# Patient Record
Sex: Male | Born: 1980 | Race: Black or African American | Hispanic: No | Marital: Single | Smoking: Former smoker
Health system: Southern US, Community
[De-identification: ages and names within clinical notes are randomized; demographics above are authoritative.]

---

## 2013-03-06 ENCOUNTER — Ambulatory Visit (INDEPENDENT_AMBULATORY_CARE_PROVIDER_SITE_OTHER): Payer: 59 | Admitting: Family Medicine

## 2013-03-06 VITALS — BP 130/86 | HR 68 | Temp 98.7°F | Resp 16 | Ht 68.0 in | Wt 195.4 lb

## 2013-03-06 DIAGNOSIS — Z0000 Encounter for general adult medical examination without abnormal findings: Secondary | ICD-10-CM

## 2013-03-06 LAB — COMPREHENSIVE METABOLIC PANEL
ALT: 44 U/L (ref 0–53)
AST: 26 U/L (ref 0–37)
Albumin: 4.8 g/dL (ref 3.5–5.2)
Alkaline Phosphatase: 145 U/L — ABNORMAL HIGH (ref 39–117)
Potassium: 4.6 mEq/L (ref 3.5–5.3)
Sodium: 138 mEq/L (ref 135–145)
Total Bilirubin: 0.5 mg/dL (ref 0.3–1.2)
Total Protein: 7.6 g/dL (ref 6.0–8.3)

## 2013-03-06 LAB — POCT CBC
Granulocyte percent: 60.4 %G (ref 37–80)
MCV: 88.2 fL (ref 80–97)
MID (cbc): 0.5 (ref 0–0.9)
MPV: 9.4 fL (ref 0–99.8)
POC Granulocyte: 4.5 (ref 2–6.9)
POC MID %: 6.1 %M (ref 0–12)
Platelet Count, POC: 340 10*3/uL (ref 142–424)
RBC: 5.37 M/uL (ref 4.69–6.13)

## 2013-03-06 LAB — LIPID PANEL
LDL Cholesterol: 84 mg/dL (ref 0–99)
VLDL: 22 mg/dL (ref 0–40)

## 2013-03-06 LAB — GLUCOSE, POCT (MANUAL RESULT ENTRY): POC Glucose: 85 mg/dl (ref 70–99)

## 2013-03-06 NOTE — Patient Instructions (Signed)
Try to keep your weight down a little bit.  Return if problems

## 2013-03-06 NOTE — Progress Notes (Signed)
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 The patient is here for job physical examination. He has no major acute complaints.  Past history: No major illnesses No surgeries Know drug allergies No regular medications   Family history parents and brother are living and well with no major familial diseases  Social history: Tobacco: None Alcohol: 2 drinks per week Illicit drugs: None Job: Automotive engineer professor, pathology and genetics Patient is single. 2 sexual partners in the last year. Uses condoms regularly.  Review of systems: Constitutional: Unremarkable HEENT: Unremarkable Respiratory: Unremarkable Cardiovascular: Unremarkable Gastrointestinal: The unremarkable Genitourinary: Unremarkable Musculoskeletal: Unremarkable Dermatologic: Unremarkable Neurologic: Unremarkable Hematologic: Unremarkable Psychiatric: Unremarkable Endocrinologic: Unremarkable   Physical examination: Mildly overweight Afro-American male in no acute distress. His TMs normal. Eyes PERRLA. Fundi benign. EOMs intact. Throat clear. Teeth fair. Next without nodes thyromegaly. No carotid bruits. Chest clear to auscultation. Heart regular without murmurs gallops or arrhythmias. And soft the mass or tenderness. Normal male external genitalia testes descended. No hernias. Extremities unremarkable. Has a large birthmark on the medial left thigh, a little larger than an egg in size.  Assessment: Normal physical examination  Plan: We'll need to complete the form for his child.     Results for orders placed in visit on 03/06/13  POCT CBC      Result Value Range   WBC 7.4  4.6 - 10.2 K/uL   Lymph, poc 2.5  0.6 - 3.4   POC LYMPH PERCENT 33.5  10 - 50 %L   MID (cbc) 0.5  0 - 0.9   POC MID % 6.1  0 - 12 %M   POC Granulocyte 4.5  2 - 6.9   Granulocyte percent 60.4  37 - 80 %G   RBC 5.37  4.69 - 6.13 M/uL   Hemoglobin 14.9  14.1 - 18.1 g/dL   HCT, POC  16.1  09.6 - 53.7 %   MCV 88.2  80 - 97 fL   MCH, POC 27.7  27 - 31.2 pg   MCHC 31.4 (*) 31.8 - 35.4 g/dL   RDW, POC 04.5     Platelet Count, POC 340  142 - 424 K/uL   MPV 9.4  0 - 99.8 fL  GLUCOSE, POCT (MANUAL RESULT ENTRY)      Result Value Range   POC Glucose 85  70 - 99 mg/dl

## 2013-12-26 ENCOUNTER — Emergency Department (HOSPITAL_COMMUNITY)
Admission: EM | Admit: 2013-12-26 | Discharge: 2013-12-26 | Disposition: A | Discharge status: Home / Self Care | Payer: BC Managed Care – PPO | Attending: Emergency Medicine | Admitting: Emergency Medicine

## 2013-12-26 ENCOUNTER — Encounter (HOSPITAL_COMMUNITY): Payer: Self-pay | Admitting: Emergency Medicine

## 2013-12-26 DIAGNOSIS — Z23 Encounter for immunization: Secondary | ICD-10-CM | POA: Insufficient documentation

## 2013-12-26 DIAGNOSIS — H16009 Unspecified corneal ulcer, unspecified eye: Secondary | ICD-10-CM | POA: Insufficient documentation

## 2013-12-26 DIAGNOSIS — H16001 Unspecified corneal ulcer, right eye: Secondary | ICD-10-CM

## 2013-12-26 DIAGNOSIS — F17200 Nicotine dependence, unspecified, uncomplicated: Secondary | ICD-10-CM | POA: Insufficient documentation

## 2013-12-26 MED ORDER — MOXIFLOXACIN HCL 0.5 % OP SOLN: 1.0000 [drp] | OPHTHALMIC | Status: DC

## 2013-12-26 MED ORDER — HYDROCODONE-ACETAMINOPHEN 5-325 MG PO TABS: 1.0000 | ORAL_TABLET | ORAL | Status: DC | PRN

## 2013-12-26 MED ORDER — FLUORESCEIN SODIUM 1 MG OP STRP
1.0000 | ORAL_STRIP | Freq: Once | OPHTHALMIC | Status: AC
  Administered 2013-12-26: 1 via OPHTHALMIC
  Filled 2013-12-26: qty 1

## 2013-12-26 MED ORDER — TETRACAINE HCL 0.5 % OP SOLN
2.0000 [drp] | Freq: Once | OPHTHALMIC | Status: AC
  Administered 2013-12-26: 2 [drp] via OPHTHALMIC
  Filled 2013-12-26: qty 2

## 2013-12-26 MED ORDER — TETANUS-DIPHTH-ACELL PERTUSSIS 5-2.5-18.5 LF-MCG/0.5 IM SUSP
0.5000 mL | Freq: Once | INTRAMUSCULAR | Status: AC
  Administered 2013-12-26: 0.5 mL via INTRAMUSCULAR
  Filled 2013-12-26: qty 0.5

## 2013-12-26 NOTE — Discharge Instructions (Signed)
Read the information below.  Use the prescribed medication as directed.  Please discuss all new medications with your pharmacist.  Do not take additional tylenol while taking the prescribed pain medication to avoid overdose.  You may return to the Emergency Department at any time for worsening condition or any new symptoms that concern you.   If you develop worsening pain in your eye, change in your vision, swelling around your eye, difficulty moving your eye, or fevers greater than 100.4, see your eye doctor or return to the Emergency Department immediately for a recheck.      Corneal Ulcer A corneal ulcer is an open sore on the cornea. The cornea is the clear covering at the front and center of the eye.  CAUSES  Most corneal ulcers are caused by infection, but there are other causes as well.  Bacterial infection. A bacterial infection can occur and cause a corneal ulcer if:  Contact lenses are worn too long (especially overnight) or are not properly cared for.  An eye injury occurs, allowing bacteria to infect the area of injury.  Viral infection. A viral infection can occur and cause a corneal ulcer if:  The eye becomes infected with a virus, such as the herpes simplex (cold sore) virus, chickenpox virus, or shingles virus.  Fungal infection. A fungal infection can occur and cause a corneal ulcer if:  An eye injury resulted from contact with a plant or plant material.  An anti-inflammatory eye drop is overused.  You have a weakened immune system.  Contact lenses are improperly cared for or become infected.  Foreign bodies in the eye, such as sand, glass, or small pieces of glass or metal.  Dry eyes.  Certain disorders that prevent eyelids from closing completely, such as Bell's palsy.  Contact lenses, especially extended-wear soft contact lenses. Contact lenses can:  Scratch the cornea's surface, allowing bacteria to enter the scratch.  Trap dirt underneath the contact lens,  which can scratch the cornea.  Harbor bacteria and fungi, making it more likely for bacterial infections to occur.  Block oxygen from the cornea, making it more likely for infections to occur. SYMPTOMS   Eye pain that is often severe.  Blurry vision.  Light sensitivity.  Pus or thick discharge coming from your eye.  Eye redness.  Feeling like something is in your eye.  Watery or itchy eye.  Burning or stinging feeling. Some ulcers that are very big may be seen as a white spot on the cornea. DIAGNOSIS  An eye exam will be performed. Your health care provider may use a special kind of microscope (slit lamp) to look at the cornea. Eye drops may be put into the eye to make the ulcer easier to see. If it is suspected that an infection caused the corneal ulcer, tissue samples or cultures from the eye may be taken. Numbing eye drops will be given before any samples or cultures are taken. The samples or cultures will be examined in the lab to check for bacteria, viruses, or fungi. TREATMENT  Treatment of the corneal ulcer depends on the cause. If your ulcer is severe, you may be given antibiotic eye drops up until your health care provider knows the test results. Other treatments can include:  Antibacterial, antiviral, or antifungal eye drops or ointment.  Removing the foreign body that caused the eye injury.  Artificial tears or a bandage contact lens if severe dry eyes caused the corneal ulcer.  Over-the-counter or prescription pain medicine.  Steroidal eye drops if the eye is inflamed and swollen.  Antibiotic medicines by mouth.  An injection of medicine under the thin membrane covering the eyeball (conjunctiva). This allows medicine to reach the ulcer in high doses.  Eye patching to reduce irritation from blinking and bright light. An eye patch may not be given if the ulcer was caused by a bacterial infection. If the corneal ulcer causes a scar on the cornea that interferes  with vision, hospitalization and surgery may be needed to replace the cornea (corneal transplant). HOME CARE INSTRUCTIONS   If prescribed, use your antibiotic pills, eye drops, or ointment as directed. Continue using them even if you start to feel better. You may have to apply eye drops as often as every few minutes to every hour, for days. It may be necessary to set your alarm clock every few minutes to every hour during the night. This is absolutely necessary.  Only take over-the-counter or prescription medicines as directed by your health care provider.  Apply artificial tears as needed if you have dry eyes.  Do not touch or rub your eye, because this may increase the irritation and spread the infection.  Avoid wearing eye makeup.  Stay in a dark room and use sunglasses if you have light sensitivity.  Apply cool packs to your eye to relieve discomfort and swelling.  If your eye is patched, you should not drive or use machinery. You will have reduced side vision and ability to judge distance.  Do not drive or operate machinery until approved by your health care provider. Your ability to judge distances is impaired.  Follow up with your health care provider as directed.  Do not wear contact lenses until your health care provider approves. If you normally wear contact lenses, follow these general rules to avoid the risk of a corneal ulcer:  Do not wear contact lenses while you sleep.  Wash your hands before removing contact lenses.  Properly sterilize and store your contact lenses.  Regularly clean your contact lens case.  Do not use your saliva or tap water to clean or wet your contact lenses.  Remove your contact lenses if your eye becomes irritated. You may put them back in once your eyes feel better. SEEK IMMEDIATE MEDICAL CARE IF:   You notice a change in your vision.  Your pain is getting worse, not better.  You have increasing discharge from the eye. MAKE SURE YOU:     Understand these instructions.  Will watch your condition.  Will get help right away if you are not doing well or get worse. Document Released: 07/20/2004 Document Revised: 02/12/2013 Document Reviewed: 11/12/2012 [facility] Patient Information 2015 [place]Care, [place]LL[place]. This information is not intended to replace advice given to you by your health care provider. Make sure you discuss any questions you have with your health care provider.

## 2013-12-26 NOTE — ED Provider Notes (Signed)
CSN: [id number]     Arrival date & time 12/26/13  1430 History  This chart was scribed for non-physician practitioner, Trixie DredgeEmily Rainie Crenshaw, PA-C,working with Audree CamelScott T Goldston, MD by Karle PlumberJennifer Tensley, ED Scribe.  This patient was seen in room TR04C/TR04C and the patient's care was started at 3:10 PM.  Chief Complaint  Patient presents with  . Eye Problem   The history is provided by the patient. No language interpreter was used.   HPI Comments:  Corey Hill is a 33 y.o. male who presents to the Emergency Department complaining of eye irritation and redness that started approximately one week ago - lasted 2 days, improved for 2 days, then has been ongoing for one week. He states he may have gotten sand in his eye while he was at the beach but he is not aware of any specific instance of this or any FB or any trauma to the eye. He states he made an appt to see his ophthalmologist on Monday (3 days from today) but wanted to get checked more quickly. He reports a "gritty" sensation in the eye. He states he has been able to put his contacts back in and then noticed the symptoms got worse. He reports "fogginess" of the iris that he noticed yesterday which prompted him to present today. Pt reports discharge of a yellowish substance four days ago. He denies fever, chills, photophobia, and visual changes or disturbances. His [facility]   History reviewed. No pertinent past medical history. History reviewed. No pertinent past surgical history. Family History  Problem Relation Age of Onset  . Throat cancer Maternal Grandfather   . Diabetes Paternal Grandmother   . Diabetes Paternal Grandfather    History  Substance Use Topics  . Smoking status: Current Some Day Smoker    Types: Cigars  . Smokeless tobacco: Not on file  . Alcohol Use: Yes    Review of Systems A complete 10 system review of systems was obtained and all systems are negative except as noted in the HPI  and PMH.   Allergies  Review of patient's allergies indicates no known allergies.  Home Medications   Prior to Admission medications   Not on File   Triage Vitals: BP 142/79  Pulse 65  Temp(Src) 98.3 F (36.8 C) (Oral)  SpO2 100% Physical Exam  Nursing note and vitals reviewed. Constitutional: He appears well-developed and well-nourished. No distress.  HENT:  Head: Normocephalic and atraumatic.  Eyes: EOM and lids are normal. Pupils are equal, round, and reactive to light. Right eye exhibits no discharge. Left eye exhibits no discharge. Right conjunctiva is injected. No scleral icterus.    Cloudy discoloration at 9 o'clock position over iris of right eye. Linear abrasion at 12 o'clock position.  Neck: Neck supple.  Pulmonary/Chest: Effort normal.  Neurological: He is alert.  Skin: He is not diaphoretic.    ED Course  Procedures (including critical care time) DIAGNOSTIC STUDIES: Oxygen Saturation is 100% on RA, normal by my interpretation.   COORDINATION OF CARE: 3:19 PM- Pt verbalizes understanding and agrees to plan.  Medications  tetracaine (PONTOCAINE) 0.5 % ophthalmic solution 2 drop (2 drops Right Eye Given 12/26/13 1502)  fluorescein ophthalmic strip 1 strip (1 strip Left Eye Given 12/26/13 1502)    Labs Review Labs Reviewed - No data to display  Imaging Review No results found.   EKG Interpretation None      3:30 PM Discussed pt with Drs Criss AlvineGoldston  and Kohut.  Slit lamp not currently functioning.  Plan to treat for corneal ulcer with close ophthalmology follow up.    MDM   Final diagnoses:  Corneal ulcer of right eye   Pt with right eye redness, FB sensation, cloudy discoloration over iris.  Visual acuity is unaffected (pt not wearing contact in that eye for testing, is wearing contact in unaffected eye).  Pt is a contact lens wearer.  Concern for ulcer.  I suspect the initial FB sensation at the beach may have been abrasion, then patient continued to  wear contact lenses, worsening the problem.  No e/o cellulitis.  No noted FB.  EOMs intact.  Unfortunately, the slit lamp was not functional and I did have other staff attempt to make it work.  Pt has follow up established with his ophthalmologist on Monday (this is a holiday weekend, not open today - Friday).  D/C home with moxifloxacin opth drops and norco.  Discussed treatment, return precautions.  Pt advised not to wear contacts until cleared by ophthalmologist.  Discussed findings, treatment, and follow up  with patient.  Pt given return precautions.  Pt verbalizes understanding and agrees with plan.      I personally performed the services described in this documentation, which was scribed in my presence. The recorded information has been reviewed and is accurate.    Trixie Dredgemily Mehtab Dolberry, PA-C 12/26/13 1629

## 2013-12-26 NOTE — ED Notes (Signed)
States was at the beach 1 week ago and got sand in his right eye. Became very red and irritated but seemed to improve. He put his contacts back in, now right eye red and irritated again.

## 2013-12-29 NOTE — ED Provider Notes (Signed)
Medical screening examination/treatment/procedure(s) were performed by non-physician practitioner and as supervising physician I was immediately available for consultation/collaboration.   EKG Interpretation None        Linsey Hirota T Lecretia Buczek, MD 12/29/13 0106 

## 2015-01-05 ENCOUNTER — Ambulatory Visit (INDEPENDENT_AMBULATORY_CARE_PROVIDER_SITE_OTHER): Payer: BLUE CROSS/BLUE SHIELD

## 2015-01-05 ENCOUNTER — Ambulatory Visit (INDEPENDENT_AMBULATORY_CARE_PROVIDER_SITE_OTHER): Payer: BLUE CROSS/BLUE SHIELD | Admitting: Emergency Medicine

## 2015-01-05 VITALS — BP 130/98 | HR 68 | Temp 98.2°F | Resp 16 | Ht 68.0 in | Wt 216.0 lb

## 2015-01-05 DIAGNOSIS — R079 Chest pain, unspecified: Secondary | ICD-10-CM

## 2015-01-05 DIAGNOSIS — J209 Acute bronchitis, unspecified: Secondary | ICD-10-CM | POA: Diagnosis not present

## 2015-01-05 MED ORDER — AZITHROMYCIN 250 MG PO TABS: ORAL_TABLET | ORAL | Status: AC

## 2015-01-05 NOTE — Patient Instructions (Signed)
Bronchitis Acute Bronchitis Bronchitis is inflammation of the airways that extend from the windpipe into the lungs (bronchi). The inflammation often causes mucus to develop. This leads to a cough, which is the most common symptom of bronchitis.  In acute bronchitis, the condition usually develops suddenly and goes away over time, usually in a couple weeks. Smoking, allergies, and asthma can make bronchitis worse. Repeated episodes of bronchitis may cause further lung problems.  CAUSES Acute bronchitis is most often caused by the same virus that causes a cold. The virus can spread from person to person (contagious) through coughing, sneezing, and touching contaminated objects. SIGNS AND SYMPTOMS   Cough.   Fever.   Coughing up mucus.   Body aches.   Chest congestion.   Chills.   Shortness of breath.   Sore throat.  DIAGNOSIS  Acute bronchitis is usually diagnosed through a physical exam. Your health care provider will also ask you questions about your medical history. Tests, such as chest X-rays, are sometimes done to rule out other conditions.  TREATMENT  Acute bronchitis usually goes away in a couple weeks. Oftentimes, no medical treatment is necessary. Medicines are sometimes given for relief of fever or cough. Antibiotic medicines are usually not needed but may be prescribed in certain situations. In some cases, an inhaler may be recommended to help reduce shortness of breath and control the cough. A cool mist vaporizer may also be used to help thin bronchial secretions and make it easier to clear the chest.  HOME CARE INSTRUCTIONS  Get plenty of rest.   Drink enough fluids to keep your urine clear or pale yellow (unless you have a medical condition that requires fluid restriction). Increasing fluids may help thin your respiratory secretions (sputum) and reduce chest congestion, and it will prevent dehydration.   Take medicines only as directed by your health care  provider.  If you were prescribed an antibiotic medicine, finish it all even if you start to feel better.  Avoid smoking and secondhand smoke. Exposure to cigarette smoke or irritating chemicals will make bronchitis worse. If you are a smoker, consider using nicotine gum or skin patches to help control withdrawal symptoms. Quitting smoking will help your lungs heal faster.   Reduce the chances of another bout of acute bronchitis by washing your hands frequently, avoiding people with cold symptoms, and trying not to touch your hands to your mouth, nose, or eyes.   Keep all follow-up visits as directed by your health care provider.  SEEK MEDICAL CARE IF: Your symptoms do not improve after 1 week of treatment.  SEEK IMMEDIATE MEDICAL CARE IF:  You develop an increased fever or chills.   You have chest pain.   You have severe shortness of breath.  You have bloody sputum.   You develop dehydration.  You faint or repeatedly feel like you are going to pass out.  You develop repeated vomiting.  You develop a severe headache. MAKE SURE YOU:   Understand these instructions.  Will watch your condition.  Will get help right away if you are not doing well or get worse. Document Released: 07/20/2004 Document Revised: 10/27/2013 Document Reviewed: 12/03/2012 [facility] Patient Information 2015 [place]ExitCare, [place]LLC. This information is not intended to replace advice given to you by your health care provider. Make sure you discuss any questions you have with your health care provider.

## 2015-01-05 NOTE — Progress Notes (Signed)
Subjective:  Patient ID: Corey Hill, male    DOB: [birth year]  Age: 34 y.o. MRN: [id number]  CC: Chest Pain; Uvula swollen; and Side pain   HPI Corey Hill presents  with a cough and chest pain. Has been coughing for about 2 weeks. He has pain in the lateral chest wall in his suprasternal region particularly with exertion or exercise. Has no shortness breath or wheezing. No fever chills or stool change. He has no Nausea vomiting. He has some moderate sputum production that is mucopurulent. His nasal congestion and drainage which is clear in color no improvement with over-the-counter medication  History Corey Hill has no past medical history on file.   He has no past surgical history on file.   His  family history includes Diabetes in his paternal grandfather and paternal grandmother; Throat cancer in his maternal grandfather.  He   reports that he quit smoking about a year ago. His smoking use included Cigars. He does not have any smokeless tobacco history on file. He reports that he drinks about 1.8 - 2.4 oz of alcohol per week. He reports that he does not use illicit drugs.  Outpatient Prescriptions Prior to Visit  Medication Sig Dispense Refill  . HYDROcodone-acetaminophen (NORCO/VICODIN) 5-325 MG per tablet Take 1 tablet by mouth every 4 (four) hours as needed for moderate pain or severe pain. 15 tablet 0  . moxifloxacin (VIGAMOX) 0.5 % ophthalmic solution Place 1 drop into the right eye every 2 (two) hours while awake. X 2 days, then Q 6 hours x 5 days 3 mL 0  . Tetrahydrozoline HCl (REDNESS RELIEVER EYE DROPS OP) Apply 1-2 drops to eye every 4 (four) hours as needed (for red, itchy eyes).     No facility-administered medications prior to visit.    History   Social History  . Marital Status: Single    Spouse Name: N/A  . Number of Children: N/A  . Years of Education: N/A   Social History Main Topics  . Smoking status: Former Smoker    Types: Cigars    Quit date:  01/04/2014  . Smokeless tobacco: Not on file  . Alcohol Use: 1.8 - 2.4 oz/week    3-4 Standard drinks or equivalent per week  . Drug Use: No  . Sexual Activity: Not on file   Other Topics Concern  . None   Social History Narrative     Review of Systems  Constitutional: Negative for fever, chills and appetite change.  HENT: Negative for congestion, ear pain, postnasal drip, sinus pressure and sore throat.   Eyes: Negative for pain and redness.  Respiratory: Negative for cough, shortness of breath and wheezing.   Cardiovascular: Negative for leg swelling.  Gastrointestinal: Negative for nausea, vomiting, abdominal pain, diarrhea, constipation and blood in stool.  Endocrine: Negative for polyuria.  Genitourinary: Negative for dysuria, urgency, frequency and flank pain.  Musculoskeletal: Negative for gait problem.  Skin: Negative for rash.  Neurological: Negative for weakness and headaches.  Psychiatric/Behavioral: Negative for confusion and decreased concentration. The patient is not nervous/anxious.     Objective:  BP 130/98 mmHg  Pulse 68  Temp(Src) 98.2 F (36.8 C) (Oral)  Resp 16  Ht  (1.727 m)  Wt 216 lb (97.977 kg)  BMI 32.85 kg/m2  SpO2 98%  Physical Exam  Constitutional: He is oriented to person, place, and time. He appears well-developed and well-nourished. No distress.  HENT:  Head: Normocephalic and atraumatic.  Right Ear: External ear  normal.  Left Ear: External ear normal.  Nose: Nose normal.  Eyes: Conjunctivae and EOM are normal. Pupils are equal, round, and reactive to light. No scleral icterus.  Neck: Normal range of motion. Neck supple. No tracheal deviation present.  Cardiovascular: Normal rate, regular rhythm and normal heart sounds.   Pulmonary/Chest: Effort normal. No respiratory distress. He has no wheezes. He has no rales.  Abdominal: He exhibits no mass. There is no tenderness. There is no rebound and no guarding.  Musculoskeletal: He  exhibits no edema.  Lymphadenopathy:    He has no cervical adenopathy.  Neurological: He is alert and oriented to person, place, and time. Coordination normal.  Skin: Skin is warm and dry. No rash noted.  Psychiatric: He has a normal mood and affect. His behavior is normal.      Assessment & Plan:   Corey Hill was seen today for chest pain, uvula swollen and side pain.  Diagnoses and all orders for this visit:  Chest pain, unspecified chest pain type Orders: -     DG Chest 2 View; Future -     EKG 12-Lead  Acute bronchitis, unspecified organism  Other orders -     azithromycin (ZITHROMAX) 250 MG tablet; Take 2 tabs PO x 1 dose, then 1 tab PO QD x 4 days  I have discontinued Corey Hill's Tetrahydrozoline HCl (REDNESS RELIEVER EYE DROPS OP), moxifloxacin, and HYDROcodone-acetaminophen. I am also having him start on azithromycin.  Meds ordered this encounter  Medications  . azithromycin (ZITHROMAX) 250 MG tablet    Sig: Take 2 tabs PO x 1 dose, then 1 tab PO QD x 4 days    Dispense:  6 tablet    Refill:  0    Appropriate red flag conditions were discussed with the patient as well as actions that should be taken.  Patient expressed his understanding.  Follow-up: Return if symptoms worsen or fail to improve.  Carmelina DaneAnderson, Keyuna Cuthrell S, MD   [facility] reading (PRIMARY) by  Dr. Dareen PianoAnderson.  Negative chest.

## 2016-06-15 IMAGING — CR DG CHEST 2V
2 series · 2 of 2 positions shown · non-contrast
Comparison: None.

CLINICAL DATA: 33-year-old male with cough

EXAM:
CHEST  2 VIEW

[PA]
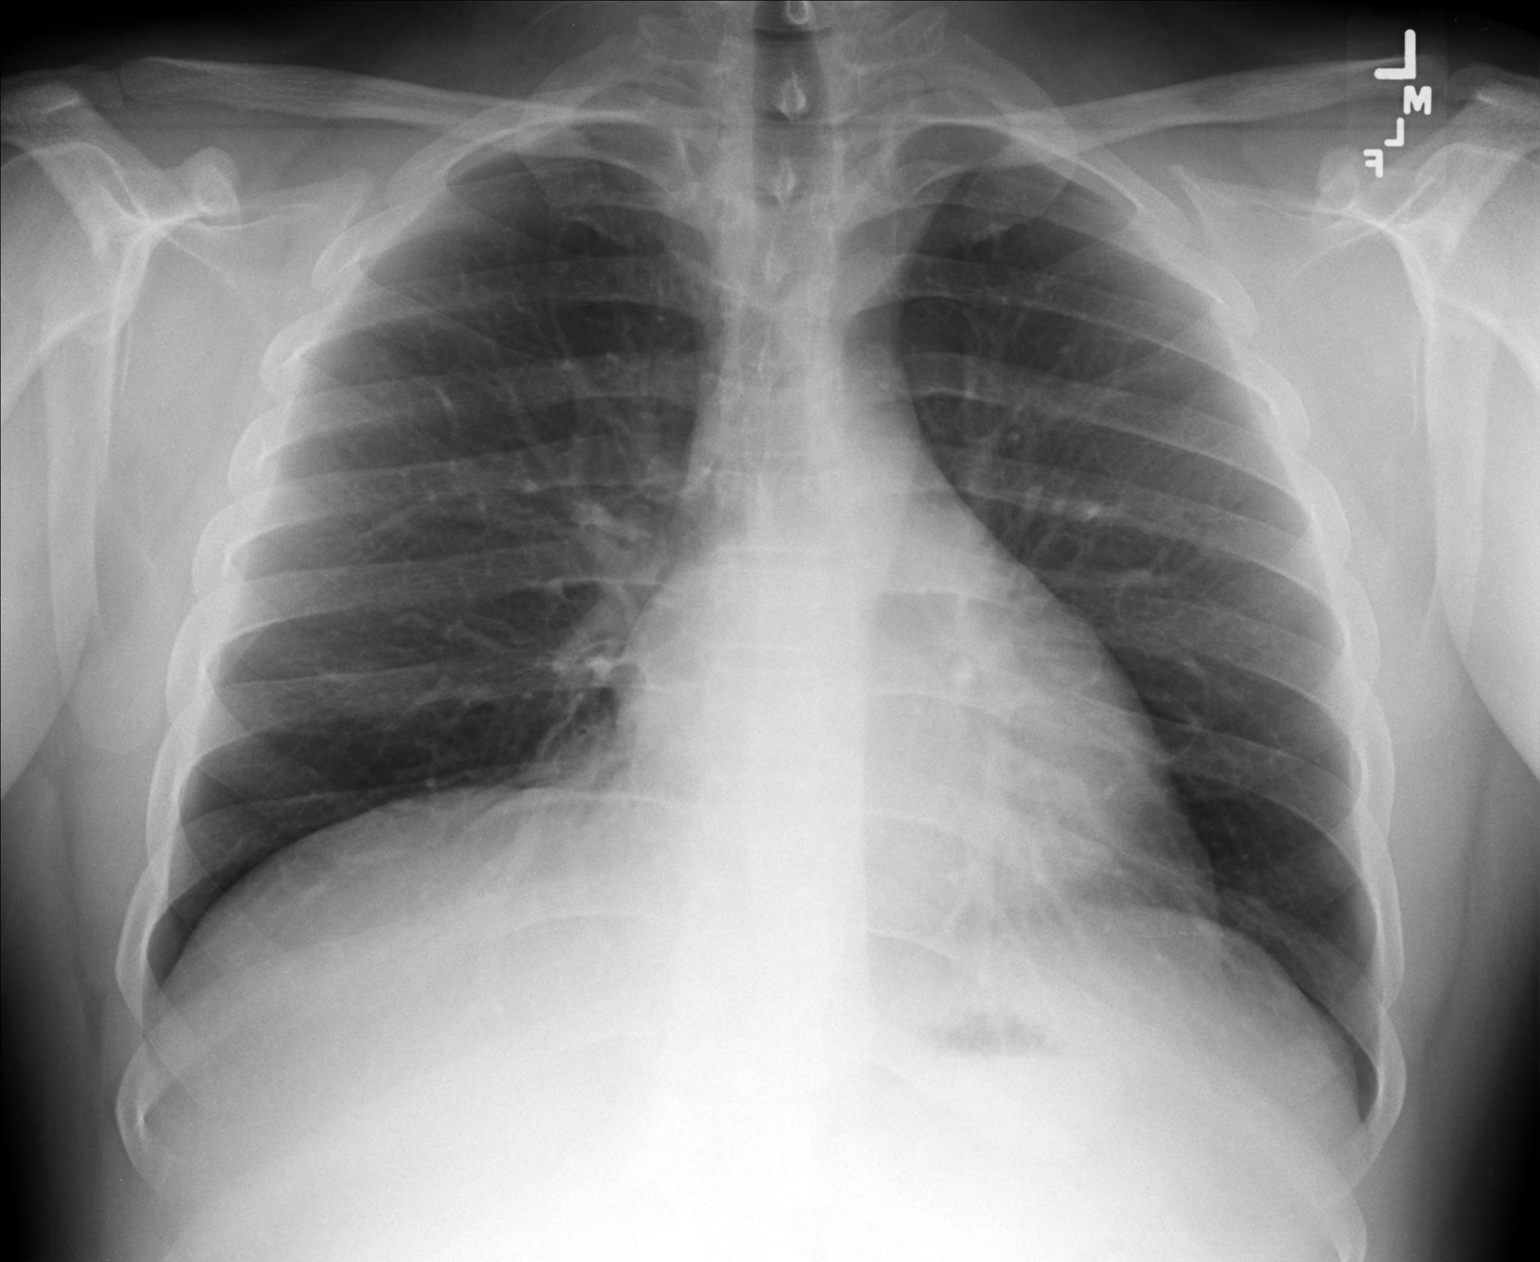

[lateral]
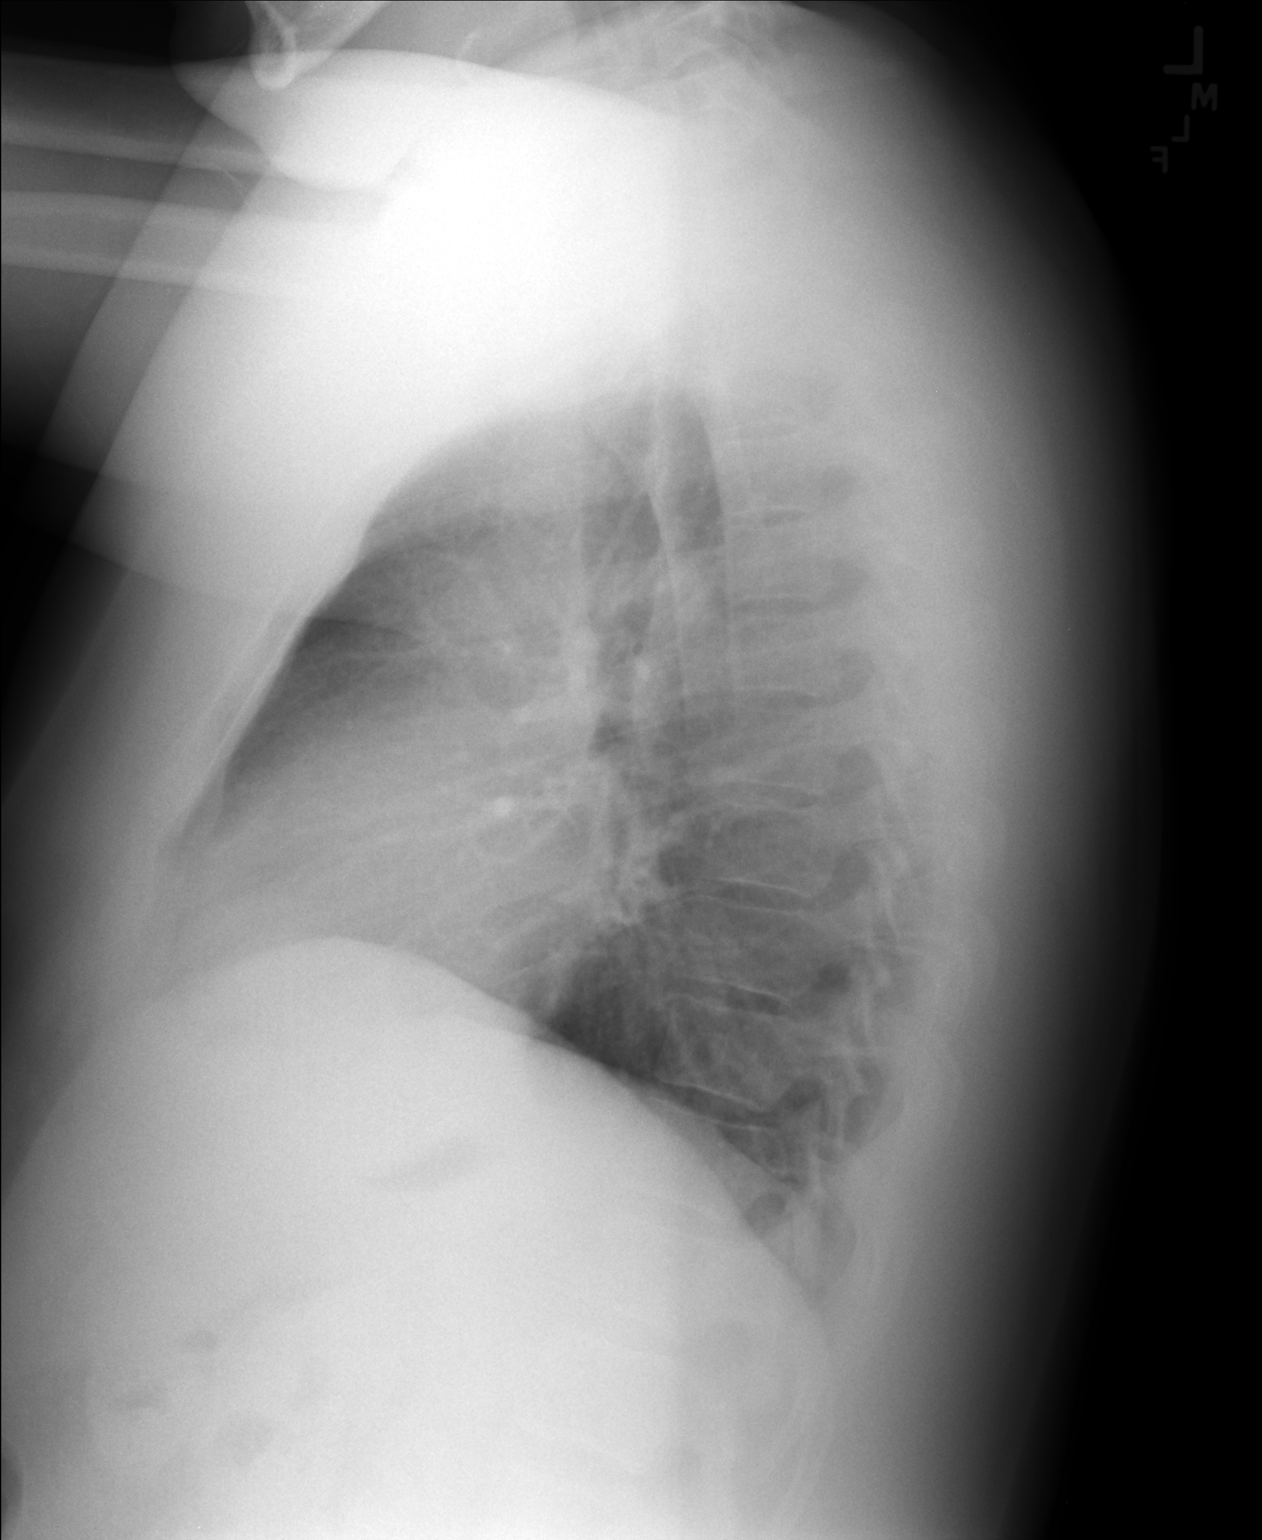

[2 of 2 positions shown; findings below may reference images not displayed]

FINDINGS: The heart size and mediastinal contours are within normal limits.
Both lungs are clear. The visualized skeletal structures are
unremarkable.
IMPRESSION: No active cardiopulmonary disease.

## 2019-08-21 ENCOUNTER — Ambulatory Visit: Payer: Self-pay | Attending: Family

## 2019-08-21 DIAGNOSIS — Z23 Encounter for immunization: Secondary | ICD-10-CM | POA: Insufficient documentation

## 2019-08-21 NOTE — Progress Notes (Signed)
   Covid-19 Vaccination Clinic  Name:  [name]    MRN: [id number] DOB: 1981-02-23  08/21/2019  Mr. Perrier was observed post Covid-19 immunization for 15 minutes without incidence. He was provided with Vaccine Information Sheet and instruction to access the V-Safe system.   Mr. Reasoner was instructed to call 911 with any severe reactions post vaccine: Marland Kitchen Difficulty breathing  . Swelling of your face and throat  . A fast heartbeat  . A bad rash all over your body  . Dizziness and weakness    Immunizations Administered    Name Date Dose VIS Date Route   Moderna COVID-19 Vaccine 08/21/2019  3:12 PM 0.5 mL 05/27/2019 Intramuscular   Manufacturer: Moderna   Lot: [id number]   NDC: 50388-828-00

## 2019-09-23 ENCOUNTER — Ambulatory Visit: Payer: Self-pay | Attending: Family

## 2019-09-23 DIAGNOSIS — Z23 Encounter for immunization: Secondary | ICD-10-CM

## 2019-09-23 NOTE — Progress Notes (Signed)
   [facility]ov[facility] Vaccination [facility]  Name:  JASMINE MACEACHERN    MRN: [id number] DOB: 01/24/1981  09/23/2019  Mr. Vecchio was observed post Covid-19 immunization for 15 minutes without incident. He was provided with Vaccine Information Sheet and instruction to access the V-Safe system.   Mr. Funari was instructed to call 911 with any severe reactions post vaccine: Marland Kitchen Difficulty breathing  . Swelling of face and throat  . A fast heartbeat  . A bad rash all over body  . Dizziness and weakness   Immunizations Administered    Name Date Dose VIS Date Route   Moderna COVID-19 Vaccine 09/23/2019 11:57 AM 0.5 mL 05/27/2019 Intramuscular   Manufacturer: Moderna   Lot: [id number]   NDC: 70141-030-13
